# Patient Record
Sex: Male | Born: 1935 | Race: White | Hispanic: No | Smoking: Never smoker
Health system: Southern US, Community
[De-identification: ages and names within clinical notes are randomized; demographics above are authoritative.]

## PROBLEM LIST (undated history)

## (undated) DIAGNOSIS — I639 Cerebral infarction, unspecified: Secondary | ICD-10-CM

## (undated) DIAGNOSIS — I2510 Atherosclerotic heart disease of native coronary artery without angina pectoris: Secondary | ICD-10-CM

## (undated) DIAGNOSIS — M1990 Unspecified osteoarthritis, unspecified site: Secondary | ICD-10-CM

## (undated) DIAGNOSIS — Z5189 Encounter for other specified aftercare: Secondary | ICD-10-CM

## (undated) DIAGNOSIS — I10 Essential (primary) hypertension: Secondary | ICD-10-CM

## (undated) DIAGNOSIS — N289 Disorder of kidney and ureter, unspecified: Secondary | ICD-10-CM

## (undated) HISTORY — PX: BACK SURGERY: SHX140

## (undated) HISTORY — PX: INNER EAR SURGERY: SHX679

## (undated) HISTORY — PX: EYE SURGERY: SHX253

---

## 2007-04-21 ENCOUNTER — Encounter: Admission: RE | Admit: 2007-04-21 | Discharge: 2007-04-21 | Payer: Self-pay | Admitting: Internal Medicine

## 2007-05-19 ENCOUNTER — Encounter (INDEPENDENT_AMBULATORY_CARE_PROVIDER_SITE_OTHER): Payer: Self-pay | Admitting: Urology

## 2007-05-19 ENCOUNTER — Ambulatory Visit (HOSPITAL_BASED_OUTPATIENT_CLINIC_OR_DEPARTMENT_OTHER): Admission: RE | Admit: 2007-05-19 | Discharge: 2007-05-19 | Payer: Self-pay | Admitting: Urology

## 2007-06-30 ENCOUNTER — Ambulatory Visit (HOSPITAL_COMMUNITY): Admission: RE | Admit: 2007-06-30 | Discharge: 2007-06-30 | Payer: Self-pay | Admitting: Urology

## 2007-07-21 ENCOUNTER — Ambulatory Visit (HOSPITAL_BASED_OUTPATIENT_CLINIC_OR_DEPARTMENT_OTHER): Admission: RE | Admit: 2007-07-21 | Discharge: 2007-07-21 | Payer: Self-pay | Admitting: Urology

## 2007-09-15 ENCOUNTER — Ambulatory Visit (HOSPITAL_COMMUNITY): Admission: RE | Admit: 2007-09-15 | Discharge: 2007-09-15 | Payer: Self-pay | Admitting: Urology

## 2008-09-10 ENCOUNTER — Emergency Department (HOSPITAL_COMMUNITY): Admission: EM | Admit: 2008-09-10 | Discharge: 2008-09-10 | Payer: Self-pay | Admitting: Emergency Medicine

## 2008-12-21 ENCOUNTER — Inpatient Hospital Stay (HOSPITAL_COMMUNITY): Admission: EM | Admit: 2008-12-21 | Discharge: 2008-12-24 | Payer: Self-pay | Admitting: Emergency Medicine

## 2008-12-21 ENCOUNTER — Ambulatory Visit: Payer: Self-pay | Admitting: Cardiology

## 2008-12-21 ENCOUNTER — Encounter: Admission: RE | Admit: 2008-12-21 | Discharge: 2008-12-21 | Payer: Self-pay | Admitting: Internal Medicine

## 2008-12-22 ENCOUNTER — Encounter (INDEPENDENT_AMBULATORY_CARE_PROVIDER_SITE_OTHER): Payer: Self-pay | Admitting: Internal Medicine

## 2008-12-22 ENCOUNTER — Ambulatory Visit: Payer: Self-pay | Admitting: Vascular Surgery

## 2009-03-05 IMAGING — US US RENAL
1 series · 14 of 25 positions shown · non-contrast
Comparison: none

CLINICAL DATA: Elevated creatinine.  
RENAL/URINARY TRACT ULTRASOUND:
TECHNIQUE: Complete ultrasound examination of the urinary tract was performed including evaluation of the kidneys, renal collecting systems, and urinary bladder.

[Series 1: unknown · 0.35mm/px · 14 of 55 slices shown]
[im 1/55]
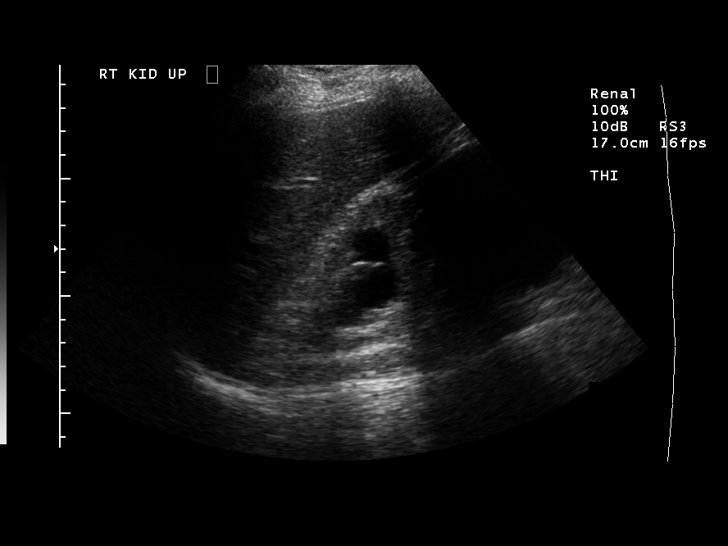
[im 5/55]
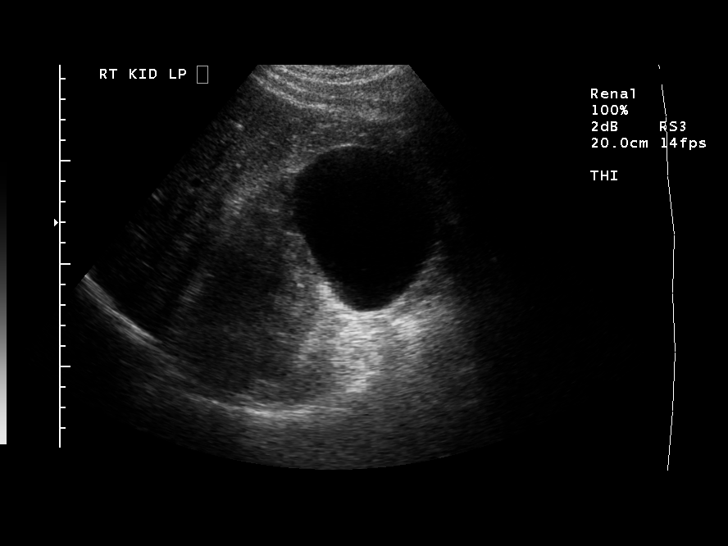
[im 10/55]
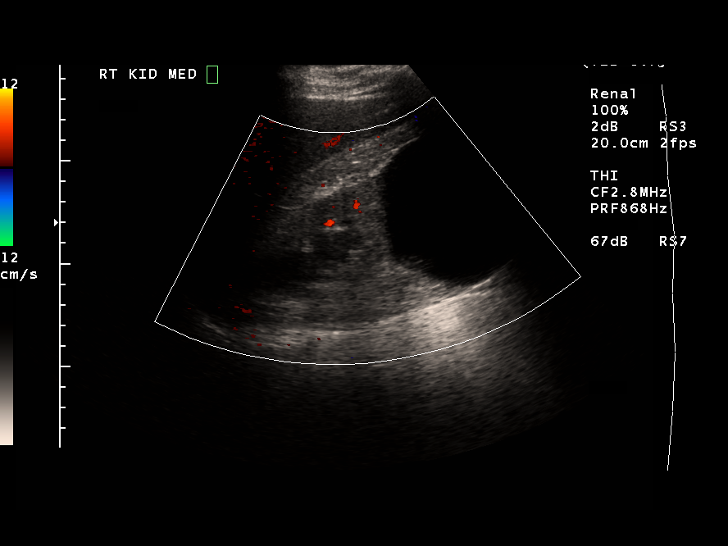
[im 14/55]
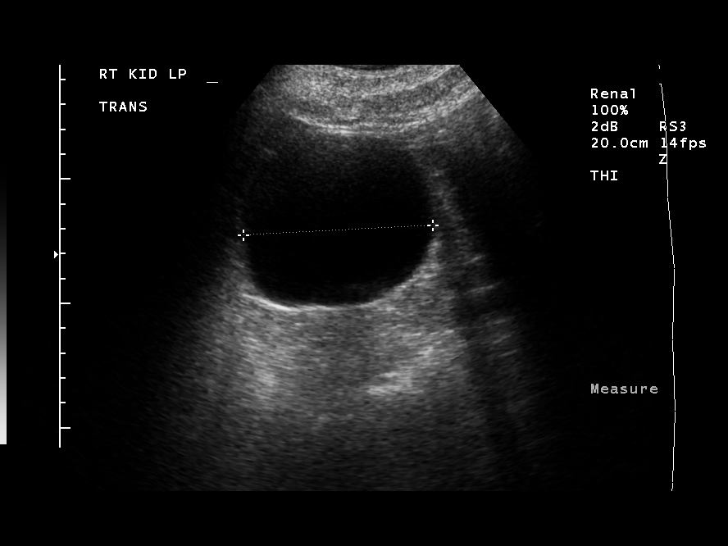
[im 19/55]
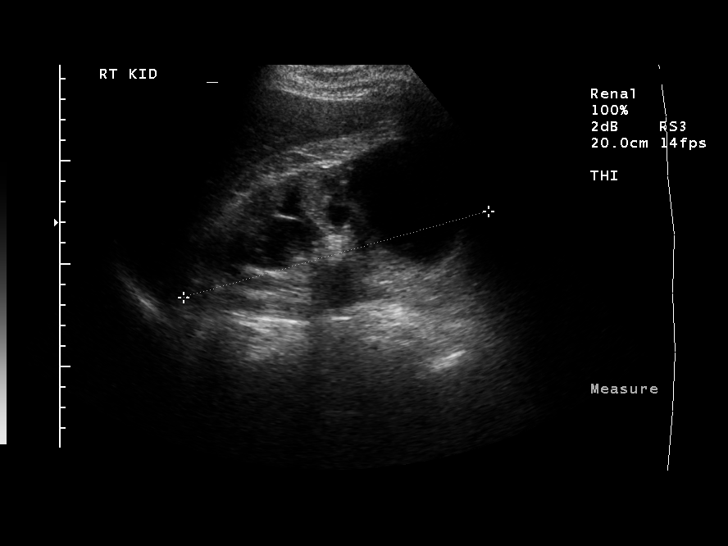
[im 21/55]
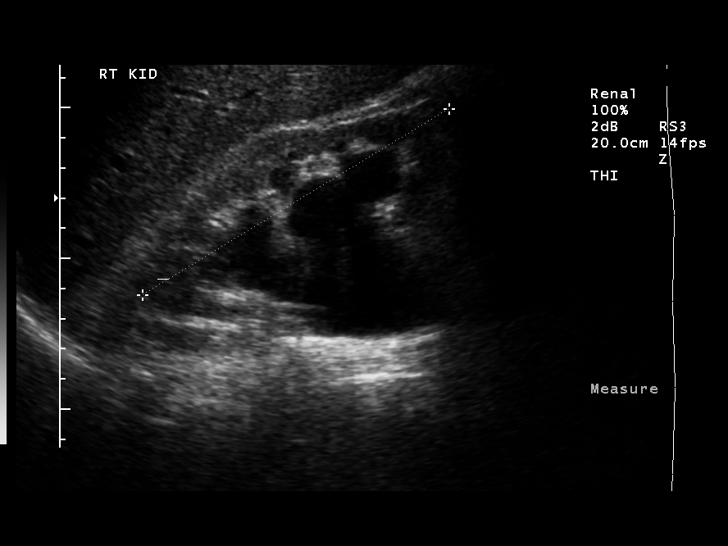
[im 25/55]
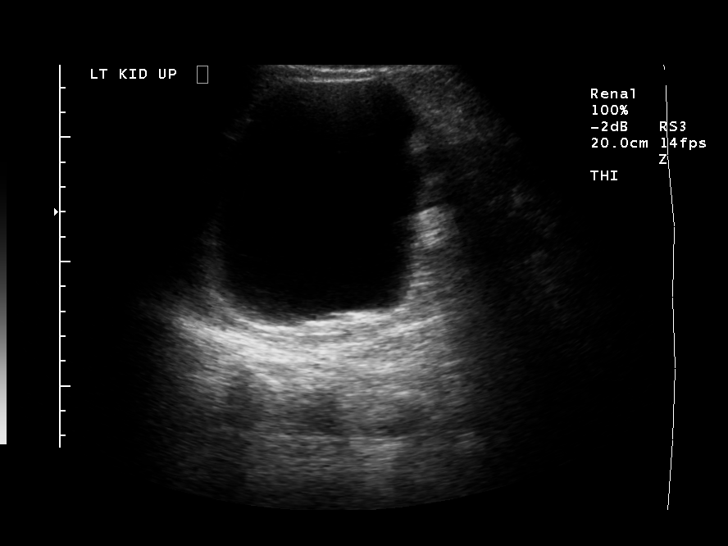
[im 30/55]
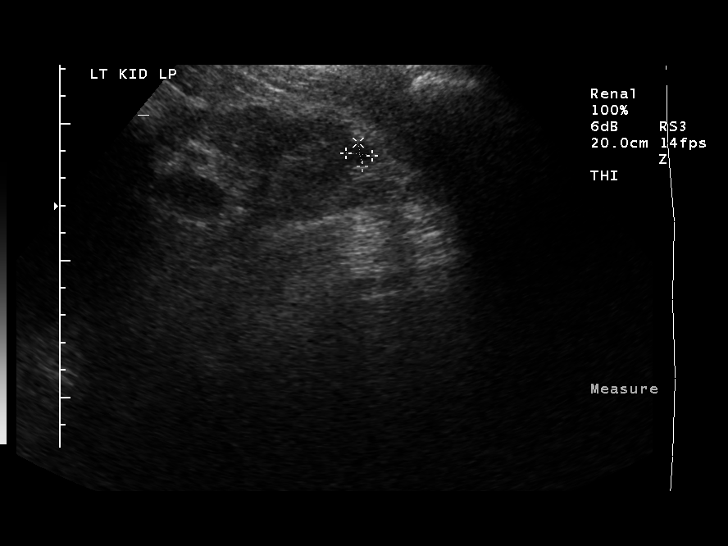
[im 34/55]
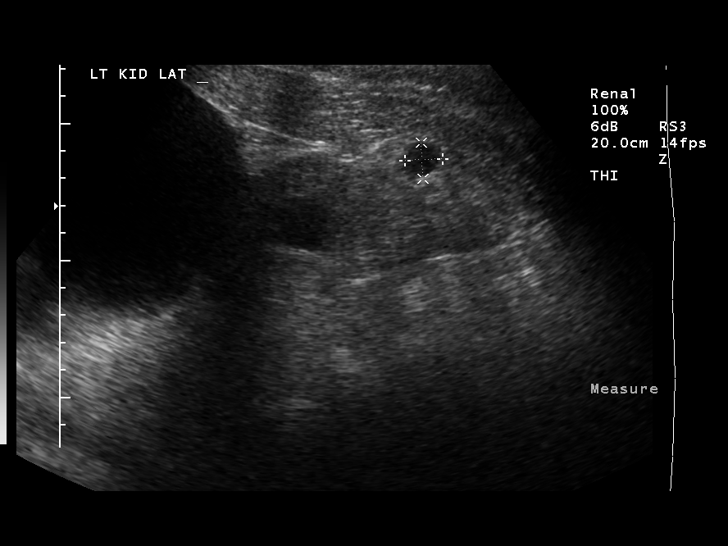
[im 37/55]
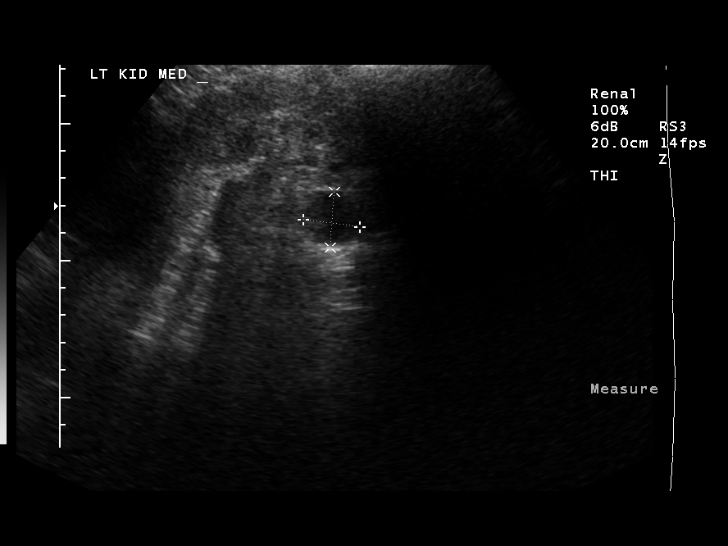
[im 41/55]
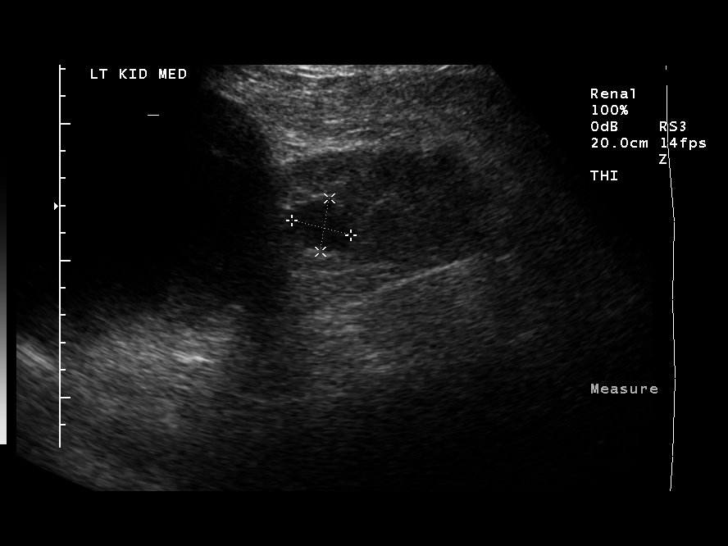
[im 46/55]
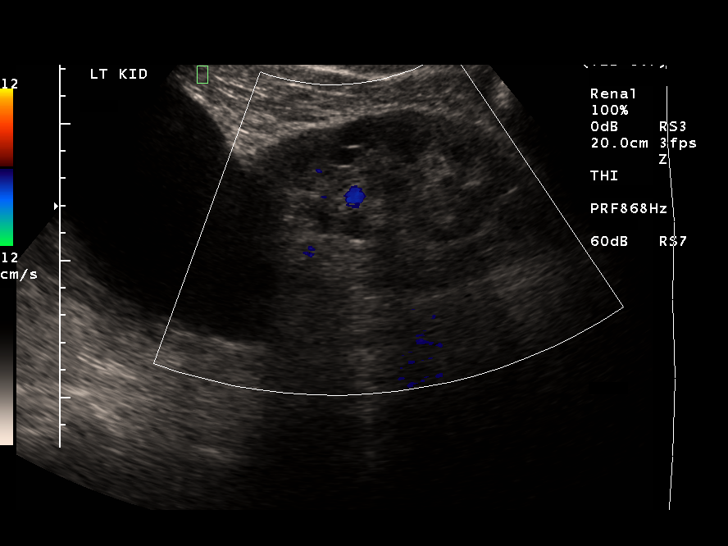
[im 50/55]
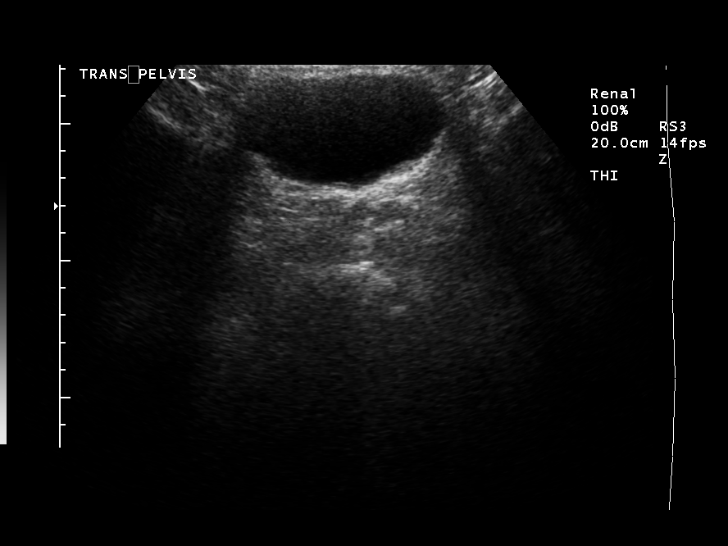
[im 55/55]
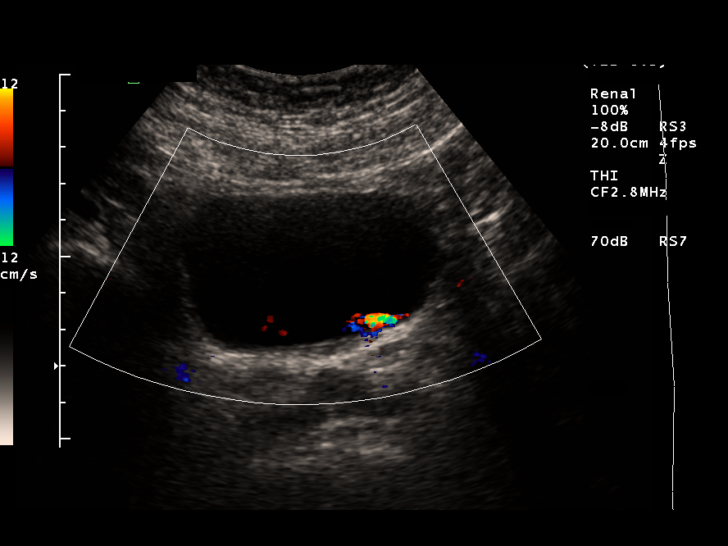

[14 of 25 positions shown; findings below may reference images not displayed]

FINDINGS: Scans over the kidneys were performed.  The right kidney measures 11.4 cm sagittally with the left kidney measuring 11.6 cm.  There is moderate right hydronephrosis and mild left hydronephrosis.  Bilateral renal cysts are present.  The largest cysts are in the upper poles.  On the right the upper pole cyst measures 7.0 x 8.1 x 7.5 cm with the upper pole cyst on the left measuring 8.2 x 9.6 x 8.7 cm.  Urinary bladder is unremarkable and bilateral ureteral jets are noted.
IMPRESSION: Bilateral hydronephrosis right more prominent than left.  Bilateral renal cysts. CT of the abdomen and pelvis may be helpful to assess point of obstruction.

## 2010-09-11 ENCOUNTER — Encounter: Admission: RE | Admit: 2010-09-11 | Discharge: 2010-09-11 | Payer: Self-pay | Admitting: Internal Medicine

## 2010-11-05 ENCOUNTER — Encounter: Payer: Self-pay | Admitting: Internal Medicine

## 2011-01-25 LAB — BASIC METABOLIC PANEL
BUN: 32 mg/dL — ABNORMAL HIGH (ref 6–23)
Chloride: 110 mEq/L (ref 96–112)
GFR calc Af Amer: 46 mL/min — ABNORMAL LOW (ref >60)
GFR calc non Af Amer: 38 mL/min — ABNORMAL LOW (ref >60)
Potassium: 4.5 mEq/L (ref 3.5–5.1)
Sodium: 138 mEq/L (ref 135–145)

## 2011-01-25 LAB — URINALYSIS, MICROSCOPIC ONLY
Ketones, ur: NEGATIVE mg/dL
Leukocytes, UA: NEGATIVE
Nitrite: NEGATIVE
Protein, ur: NEGATIVE mg/dL
Urobilinogen, UA: 0.2 mg/dL (ref 0.0–1.0)

## 2011-01-25 LAB — LIPID PANEL
Cholesterol: 161 mg/dL (ref 0–200)
HDL: 42 mg/dL (ref >39)
Triglycerides: 87 mg/dL (ref <150)

## 2011-01-25 LAB — CBC
HCT: 34.8 % — ABNORMAL LOW (ref 39.0–52.0)
HCT: 40.3 % (ref 39.0–52.0)
Hemoglobin: 13.1 g/dL (ref 13.0–17.0)
Hemoglobin: 14 g/dL (ref 13.0–17.0)
MCHC: 34.6 g/dL (ref 30.0–36.0)
MCV: 101.7 fL — ABNORMAL HIGH (ref 78.0–100.0)
MCV: 98.4 fL (ref 78.0–100.0)
Platelets: 155 10*3/uL (ref 150–400)
Platelets: 161 10*3/uL (ref 150–400)
RBC: 3.43 MIL/uL — ABNORMAL LOW (ref 4.22–5.81)
RBC: 4.1 MIL/uL — ABNORMAL LOW (ref 4.22–5.81)
RDW: 13.8 % (ref 11.5–15.5)
WBC: 6.5 10*3/uL (ref 4.0–10.5)
WBC: 7.4 10*3/uL (ref 4.0–10.5)

## 2011-01-25 LAB — COMPREHENSIVE METABOLIC PANEL
ALT: 12 U/L (ref 0–53)
AST: 21 U/L (ref 0–37)
Albumin: 3.6 g/dL (ref 3.5–5.2)
Chloride: 109 mEq/L (ref 96–112)
Creatinine, Ser: 1.79 mg/dL — ABNORMAL HIGH (ref 0.4–1.5)
GFR calc Af Amer: 44 mL/min — ABNORMAL LOW (ref >60)
GFR calc non Af Amer: 36 mL/min — ABNORMAL LOW (ref >60)
Glucose, Bld: 90 mg/dL (ref 70–99)
Potassium: 4.7 mEq/L (ref 3.5–5.1)
Sodium: 138 mEq/L (ref 135–145)

## 2011-01-25 LAB — CK TOTAL AND CKMB (NOT AT ARMC)
CK, MB: 1.4 ng/mL (ref 0.3–4.0)
Relative Index: 1.2 (ref 0.0–2.5)
Relative Index: 1.2 (ref 0.0–2.5)
Total CK: 119 U/L (ref 7–232)

## 2011-01-25 LAB — DIFFERENTIAL
Basophils Relative: 0 % (ref 0–1)
Lymphs Abs: 2 10*3/uL (ref 0.7–4.0)
Monocytes Relative: 9 % (ref 3–12)
Neutrophils Relative %: 57 % (ref 43–77)

## 2011-01-25 LAB — HEMOGLOBIN A1C: Hgb A1c MFr Bld: 5.6 % (ref 4.6–6.1)

## 2011-01-25 LAB — URINE CULTURE: Culture: NO GROWTH

## 2011-01-25 LAB — APTT: aPTT: 28 seconds (ref 24–37)

## 2011-01-25 LAB — TROPONIN I
Troponin I: 0.01 ng/mL (ref 0.00–0.06)
Troponin I: 0.01 ng/mL (ref 0.00–0.06)

## 2011-01-25 LAB — PROTIME-INR: Prothrombin Time: 13.6 seconds (ref 11.6–15.2)

## 2011-02-27 NOTE — Op Note (Signed)
NAME:  Gavin Duran, Gavin Duran                 ACCOUNT NO.:  [id number]   MEDICAL RECORD NO.:  [id number]          PATIENT TYPE:  AMB   LOCATION:  DAY                          FACILITY:  [facility]   PHYSICIAN:  Mark C. Vernie Ammons, M.D.  DATE OF BIRTH:  1920/03/21   DATE OF PROCEDURE:  09/15/2007  DATE OF DISCHARGE:                               OPERATIVE REPORT   PREOPERATIVE DIAGNOSIS:  Right ureteral calculi.   POSTOPERATIVE DIAGNOSIS:  Right ureteral calculi.   PROCEDURE:  Cystoscopy, right retrograde pyelogram with interpretation,  right ureteroscopy, laser lithotripsy, stone extraction and Polaris  stent placement.   SURGEON:  Mark C. Vernie Ammons, M.D.   ANESTHESIA:  General.   SPECIMEN:  Stone to patient.   DRAINS:  5-French 26 cm Polaris stent (no string).   BLOOD LOSS:  Minimal.   COMPLICATIONS:  None.   INDICATIONS:  The patient is a 77 year old white male who initially was  found to have hydronephrosis and a right ureteral stone was identified,  treated with lithotripsy.  Lithotripsy was unsuccessful.  He was brought  to the operating room for ureteroscopic extraction of the stone and I  was able to fragment the stone.  It was extremely hard and I was able to  remove what appeared to be a majority of the stone but there were some  fragments remaining.  I left the stent and waited the fragments to pass;  however, they appeared to have hung up in the ureter both in the upper  ureter and lower ureter.  He is brought to the operating room for repeat  ureteroscopic extraction of the remaining fragments.  The risks,  complications and alternatives have been discussed.  He understands and  elected to proceed.   DESCRIPTION OF OPERATION:  After informed consent, the patient was  brought to the major OR, placed on the table and administered general  anesthesia, then moved to the dorsal lithotomy position.  His genitalia  sterilely prepped and draped and official time-out was then  performed.   A 6-French rigid ureteroscope was then passed per urethra under direct  visualization into the bladder.  Again no lesions were found within the  bladder.  The right orifice noted to be widely patent and I was easily  able to pass the scope into the right orifice and up the right ureter.  Several centimeters above the bladder, a stone was encountered.  It was  grasped with the nitinol basket and extracted.  The second stone was  noted to be proximal to this, however it was felt to be too large and of  a configuration that would not easily be extractable.   The 273 micron holmium laser fiber was then passed through the  ureteroscope and the stone was fragmented.  After fragmenting it into  several smaller pieces, the nitinol basket was then reinserted and the  small pieces were then extracted.  I then continued to remove all distal  ureteral fragments.   I then passed the rigid ureteroscope up the right ureter and then noted  a second group of stones.  To  further delineate this a retrograde  pyelogram was performed.  This was performed in standard fashion by  injecting full strength contrast through the ureteroscope under direct  fluoroscopic control.  This revealed filling defects consistent with the  ureteral stones and blunting of the calyces.  The renal collecting  system though appeared to have no filling defects or mass effect.  It  appeared entirely normal.   I was able to extract one of the lead fragments with the nitinol basket.  However of the remaining fragments appeared large.  I therefore  reinserted and the laser and began to fragment the stones; however, they  migrated back into the kidney.   A 0.038 inches floppy tip guidewire was then passed through the  ureteroscope and left in place.  The rigid ureteroscope was removed and  the flexible ureteroscope was passed over the guidewire into the area of  the kidney.  The guidewire was removed.  The renal pelvis  and kidney was  fully inspected and this stone was seen in the upper pole.  This was  grasped with nitinol basket pulled into the ureter and was too large to  extract so I fragmented it with the laser and then extracted the  individual pieces with a nitinol basket.  I then was able to reinsert  the flexible ureteroscope per urethra and under direct visualization was  able to pass it up the right ureter and into the renal pelvis again.  Again I identified one further stone and then injected further contrast  and made sure I was in the visualizing all of the calyces.  Each calix  was visited individually with the flexible ureteroscope and only one  further stone remained in the kidney that could be visualized.  I  therefore grasped that with a nitinol basket and again brought it down  approximately the mid ureteral region where it was then fully fragmented  with the laser and the fragments were then extracted.  I then inserted  the guidewire and reinserted the flexible scope into the area of the  renal pelvis and inspected this one last time noting each calix was free  of stones.  There were no stones in the renal pelvis and no stones were  seen along the course of the ureter as the scope was then withdrawn down  to the lower ureter.  I passed it on up the ureter and passed a  guidewire in the renal pelvis then removed the ureteroscope.   The 22-French cystoscope and 12 degrees lens were then back loaded over  the guidewire and the Polaris stent was passed into the area of the  right renal pelvis.  The guidewire was removed and good curl was noted  in the renal pelvis.  I then noted the distal aspect of the stent in the  bladder in good position and drained the bladder, removed the  cystoscope.  The patient was awakened and taken to recovery room in  stable satisfactory condition.  He tolerated procedure well.  There were  no intraoperative complications.  He will be given a prescription  for 30  Pyridium 200 mg tabs and return to my office in 7 days for stent  removal.      Mark C. Vernie Ammons, M.D.  Electronically Signed     MCO/MEDQ  D:  09/15/2007  T:  09/15/2007  Job:  [id number]

## 2011-02-27 NOTE — Op Note (Signed)
NAMEOSAMA, Gavin Duran                 ACCOUNT NO.:  [id number]   MEDICAL RECORD NO.:  [id number]          PATIENT TYPE:  AMB   LOCATION:  [facility]                         FACILITY:  [facility]   PHYSICIAN:  Mark C. Vernie Ammons, M.D.  DATE OF BIRTH:  [birth year]   DATE OF PROCEDURE:  07/21/2007  DATE OF DISCHARGE:                               OPERATIVE REPORT   PREOPERATIVE DIAGNOSIS:  Right ureteral calculus.   POSTOPERATIVE DIAGNOSIS:  Right ureteral calculus.   OPERATION PERFORMED:  1. Cystoscopy.  2. Right ureteroscopy.  3. Right retrograde pyelogram with interpretation.  4. Laser lithotripsy.  5. Double-J stent placement.   SURGEON:  Mark C. Vernie Ammons, M.D.   ANESTHESIA:  General.   DRAINS:  A 7 French 26 cm double-J stent in the right ureter (no  string).   ESTIMATED BLOOD LOSS:  The blood loss was minimal.   SPECIMENS:  None.   COMPLICATIONS:  None.   INDICATIONS:  The patient is an 77 year old white male who was found  have right hydronephrosis.  Because of some renal insufficiency he was  brought to the operating room for retrograde pyelogram and I thought for  soft filling defect in the ureter.  When I scoped the ureter I did not  see a stone.  I thought I saw some irritation and biopsy as well as  brush did not find any malignancy.  Then he had a stent for a while and  after I took his stent out the next day he had right flank pain and now  had a stone in his right ureter that appeared not to have been in the  ureter at the time of the procedure to since I passed the scope nearly  to the UPJ and was not able to visualize the stone.  He elected to  proceed with lithotripsy of the stone; however, lithotripsy was  unsuccessful.  He is therefore brought to the operating room for a  ureteroscopic treatment of stone with laser lithotripsy.  I went over  the procedure, its risks and complications with him.  He understood and  elected to proceed.   DESCRIPTION OF THE OPERATION:   After informed consent the patient was  brought to the major OR, placed table, administered general anesthesia  and then was moved the dorsal lithotomy position.  The genitalia was  sterilely prepped and draped, and official timeout was performed.  I  then initially inserted the 6 French rigid ureteroscope under direct  visualization down the urethra and into the bladder.  I was able to  visualize the right ureteral orifice and I was easily able to pass the  scope into the right orifice and up the right ureter under direct  visualization.  I was able to see the stone on fluoroscopy and the scope  passed nearly to the stoma, but I could not quite see the stone.  I  therefore performed a retrograde pyelogram.   Retrograde pyelogram was performed through the rigid scope using full  strength contrast and I noted an S turn in the ureter that was not being  caused by the scope pushing the ureter upward, but likely was because of  the presence of previous hydronephrosis causing some tortuosity of the  ureter.  It did not appear obstructed in that location though.  I  therefore passed a 0.038 inch Glidewire through the ureteroscope and  through the area; and, was able to then pass the scope over the wire and  up to the stone.  It was visualized and  because of a dilated proximal  ureter and my concern that the stone would be difficult to treat because  it would float away, I elected to use a stone cone.   The stone cone was passed through the ureteroscope.  I was able to  deploy the cone beyond the stone, but it turned out that the ureter was  so dilated that the cone would not maintain the stone; therefore, I  removed that.  I then passed the holmium laser fiber through the rigid  scope and began to treat the stone; however, it was difficult because  the stone was not held in the ureter since it was dilated.  I therefore  placed the patient in reverse Trendelenburg to try get the stone to   maintain position.  However, this was not particularly successful.  The  stone kept migrating up and I felt there was need for the flexible  ureteroscope as the stone was passing into the kidney.   I passed a guidewire through the ureteroscope and removed the rigid  ureteroscope, and replaced with a 6 French flexible ureteroscope.  I  then removed the guidewire and was able to visualize the stone again in  the upper ureter.  I began treating it again with the laser and it did  fall back into the kidney.  Once it had done so I then placed the  patient in reverse Trendelenburg and tried to get all the stones to  aggregate in one location in the kidney.  I then be continued to treat  them until they appeared to be fully fragmented.  Because the system was  so capacious I inspected the entire kidney and I did not find any large  pieces of stone.  I could not see any large pieces on fluoroscopy  either; however, I think it is possible that there may still be some  fairly sizable pieces of stone just because of the large size and its  extremely hard in nature.   I reinserted a guidewire through the ureteroscope, removed the  ureteroscope and back loaded the cystoscope.  The 7 French 26 cm double-  J stent was then passed over the guidewire into the area of the renal  pelvis and the guidewire was removed.  A good curl was noted in the  renal pelvis and in the bladder fluoroscopically, and the bladder was  drained.  The cystoscope was removed.   The patient was awakened and taken to the recovery room in stable  satisfactory condition.  He tolerated procedure well.  There were no  intraoperative complications.   I wrote him a prescription for 3 days of Cipro at 250 mg twice a day.  I  am going to see him back in 1 week in the office for a KUB. I told his  family that it is very possible that there may still be some large  fragments that may require a second ureteroscopic treatment, but I  would  wait to see what the x-ray showed.  Mark C. Vernie Ammons, M.D.  Electronically Signed     MCO/MEDQ  D:  07/21/2007  T:  07/22/2007  Job:  [id number]

## 2011-02-27 NOTE — Op Note (Signed)
NAMETYRAE, ALCOSER                 ACCOUNT NO.:  [id number]   MEDICAL RECORD NO.:  [id number]          PATIENT TYPE:  AMB   LOCATION:  [facility]                         FACILITY:  [facility]   PHYSICIAN:  Mark C. Vernie Ammons, M.D.  DATE OF BIRTH:  November 24, 1919   DATE OF PROCEDURE:  05/19/2007  DATE OF DISCHARGE:                               OPERATIVE REPORT   PREOPERATIVE DIAGNOSES:  1. Bilateral hydronephrosis (right greater than left).  2. Elevated creatinine.   POSTOPERATIVE DIAGNOSES:  1. Right hydronephrosis.  2. Elevated creatinine.  3. Possible right ureteral lesion.   PROCEDURE:  Cystoscopy, bilateral retrograde pyelograms with  interpretation, right ureteroscopy, right ureteral biopsy and double-J  stent placement in the right ureter (6-French, 26 cm).   SURGEON:  Dr. Vernie Ammons.   ANESTHESIA:  General.   SPECIMENS:  1. Right ureteral biopsy.  2. Right ureteral washings.  3. Right ureteral brushing, all sent to pathology.   BLOOD LOSS:  Minimal.   COMPLICATIONS:  None.   INDICATIONS:  The patient is an 77 year old white male who had a normal  creatinine of 1.64 in February 2008. Four months later it had elevated  to 2.13 and an ultrasound revealed moderate right hydronephrosis and  mild left hydronephrosis.  The patient had no voiding symptoms.  Because  of it is elevated creatinine, we discussed evaluation with cystoscopy  and retrograde pyelograms with possible ureteroscopy.  The risks,  complications, and alternatives were discussed.  The patient understands  and elected to proceed.   DESCRIPTION OF OPERATION:  After informed consent, the patient was  brought to the major OR, placed on the table and administered general  anesthesia and then moved to the dorsal lithotomy position after which  time an official time-out was obtained.  His genitalia was then  sterilely prepped and draped.  Initially a 22-French cystoscope was  introduced. Under direct visualization with a 12  degree lens, the  urethra is noted to be entirely normal without lesion down to the  sphincter which appears intact.  The prostatic urethra is well resected,  no lesions are noted.  The bladder neck is wide open and the bladder was  then entered and noted to be lined with normal-appearing pink mucosa  throughout.  It was fully inspected and no tumor, stones or inflammatory  lesions were seen.  Both ureteral orifice orifices were easily  identified and both were noted to be effluxing clear urine.   A right retrograde pyelogram was then performed by passing a 6-French  open-ended ureteral catheter through the cystoscope and into the opening  of the right ureter.  I then injected full strength iodinated contrast  through the open-ended stent up the right ureter under direct  fluoroscopy.  This revealed a normal-appearing ureter all the way to an  area just below the UPJ on the right-hand side.  There was difficulty  getting the contrast to pass on into the kidney.  I therefore passed a  0.038 inch floppy-tip guidewire through the open-ended catheter up the  right ureter and into the area of the renal pelvis.  I then passed the  open-ended ureteral catheter up to the area just below the UPJ and  removed the guidewire and then reinjected contrast.  Doing this, I  identified what appeared to be a persistent filling defect in the ureter  with a normal ureter distal to that and a hydronephrotic kidney  proximally. I advanced the open-ended catheter into the renal pelvis and  injected further contrast noting severe blunting of the calyces  indicating longstanding obstruction.  I then inserted the guidewire  again through the open-ended stent and removed the stent.  The  cystoscope was used to removed as well.   A 6-French rigid long ureteroscope was then passed under direct  visualization down the urethra and into the right ureteral orifice  without difficulty.  I then passed the scope up the  right ureter under  direct visualization.  When I got to the area that I had previously  noted a filling defects, I could not definitely identify a lesion or  stone to account for this although there did appear to be some either  inflammation or papillary changes of the ureter.  I then advanced the  scope on into the renal pelvis and noted no evidence of UPJ obstruction.  I then withdrew the scope back to the area that was previously seen and  appeared different than the remainder of the ureter.  I then passed a  brush through the ureteroscope and obtained a brush biopsy of the area.  This was sent to pathology.  I then obtained urine in a barbotage  fashion from that location while actually using the scope to try to  dislodge some cells from that area while aspirating the fluid through  the ureteroscope.  This was sent for cytology.  Then I passed a biopsy  forceps through the ureteroscope and obtained representative biopsies of  the area.  There was no significant bleeding noted.  I then removed the  ureteroscope.  The guidewire had remained in place so  I back-loaded the  cystoscope over the guidewire and passed the double-J stent over the  guidewire into the area of the renal pelvis removing the guidewire. A  good curl was noted in the renal pelvis and the bladder.  I then drained  the bladder.   I then identified the left ureteral orifice.  A left retrograde  pyelogram was performed in an identical fashion as the right side,  however, this revealed the ureter to be entirely normal throughout its  length with no filling defects or other abnormality.  The collecting  system was sharp with no blunting of the calyces and no filling defects  or mass effect.  My interpretation of this left retrograde pyelogram was  that it is entirely normal.  I then drained the bladder and removed the  cystoscope.  The patient was awakened and taken to the recovery room in  stable satisfactory condition.   He tolerated the procedure well, there  were no intraoperative complications.  He will be given a prescription  for Pyridium Plus #36 and Vicodin ES #28.  He will follow-up in my  office in 1 week to discuss the pathology report and further treatment  based upon that.      Mark C. Vernie Ammons, M.D.  Electronically Signed     MCO/MEDQ  D:  05/19/2007  T:  05/19/2007  Job:  [id number]

## 2011-02-27 NOTE — H&P (Signed)
NAME:  Gavin Duran, BARREN NO.:  [id number]   MEDICAL RECORD NO.:  [id number]          PATIENT TYPE:  EMS   LOCATION:  MAJO                         FACILITY:  [facility]   PHYSICIAN:  Elliot Duran, M.D.    DATE OF BIRTH:  [birth year]   DATE OF ADMISSION:  12/21/2008  DATE OF DISCHARGE:                              HISTORY & PHYSICAL   PRIMARY CARE PHYSICIAN:  Antony Madura, M.D.   CHIEF COMPLAINT:  Right-sided weakness, word searching, generalized  malaise and possible syncopal episode.   HISTORY OF PRESENT ILLNESS:  The patient is an 77 year old man with a  past medical history significant for hypertension, hyperlipidemia and  TIAs in the past.  He presents as a direct admission for further  evaluation of right-sided weakness, word searching, generalized malaise  and a possible syncopal episode.  Gavin Duran evaluated the  patient yesterday in his office.  He ordered an MRI of the brain for  evaluation.  The MRI of the brain was performed today and it revealed a  1.5 cm acute infarction affecting the anterior  thalamus/posterior limb internal capsule on the left.  The patient is  very hard of hearing and the history is being provided by not only the  patient, but also by the patient's niece, Gavin Duran.  Accordingly, over  the past 3 days the patient had not been feeling well.  He fell 3 days  ago because he became dizzy and he believes that he passed out.  A  friend who was present at home helped him up.  He apparently regained  consciousness immediately.  There was no obvious head trauma.  During  that day, the patient slept quite a bit according to the history.  However, there was no evidence of slurred speech, difficulty swallowing  or a facial droop.  Over the 2 following days, Gavin Duran noticed that  the patient's right foot was dragging a little while he was walking.  She also noticed that he was having difficulty finding the right words  to say when  he was speaking.  Over the past 48 hours, the patient's  appetite actually improved and he was eating without any difficulty.  In  fact, Gavin Duran felt that he was back to baseline yesterday.  Nevertheless, he did present to Dr. Su Duran' office for further  evaluation.  When the results of the MRI were resulted, the patient was  advised to come to the hospital.  Currently, the patient has no  complaints.  He does acknowledge that he is somewhat weaker on his right  side.  He is right-handed.  He says that at times he has had difficulty  swallowing in the past, but no worsening difficulty swallowing over the  last 24-48 hours.  He denies headache, numbness, blurred vision,  dizziness currently, chest pain, shortness of breath, abdominal pain,  painful urination, nausea, vomiting, diarrhea or bloody stools.   The patient's vital signs are currently within normal limits.  He is  being admitted for further evaluation and management.   PAST MEDICAL HISTORY:  1.  Hypertension.  2. Hyperlipidemia.  3. BPH.  4. Chronic dizziness, question vertigo.  5. Macular degeneration.  6. Acute renal failure with a creatinine of 2.13 secondary to      hydronephrosis from a right ureteral calculus in August 2008.  7. Status post cystoscopy, right uroscopy, laser lithotripsy and      placement of double-J stent per Dr. Vernie Duran in October 2008.  8. Status post cystoscopy, right retrograde pyelogram with      interpretation, right ureteroscopy, laser lithotripsy, stone      extraction and Polaris stent placement per Dr. Vernie Duran in December      2008.  9. Fractured left elbow in November 2009.  10.Chronic pruritus/chronic dermatitis.  The patient is followed by      dermatologist, Dr. Lonni Duran.  The patient was also evaluated by a      dermatologist at [facility] in the past.  The      exact diagnosis is unclear.  11.History of TIAs  12.Chronic tremor.  13.Chronic hard of  hearing/decrease in hearing acuity.   MEDICATIONS:  1. Flomax 0.4 mg daily.  2. Lisinopril/HCTZ 20/12.5 mg daily.  3. Simvastatin 40 mg daily.  4. Aspirin 81 mg daily.  5. Macular Protect Complete supplement once daily.  6. Beta Carotene Free supplement once daily.  7. Advanced Omega 3 supplement daily.  8. Betamethasone Dipropionate 0.5% daily as needed for itching.   ALLERGIES:  NO KNOWN DRUG ALLERGIES.   SOCIAL HISTORY:  The patient is widowed.  [place]  He has an Geophysicist/field seismologist who comes in every Wednesday and  Friday.  His closest niece, Gavin Duran (phone number [phone]) is  his durable power of attorney.  She is present today.  The patient is a  retired Banker.  He denies smoking tobacco use.  However, he  does chew a pack of tobacco daily.  He generally has 1 beer at bedtime  nightly.  He still drives.   FAMILY HISTORY:  Positive for emphysema, brain cancer and old age.   PHYSICAL EXAMINATION:  VITAL SIGNS:  Temperature 98.1, blood pressure  118/63, pulse 63, respiratory rate 20, oxygen saturation 97% on room  air.  GENERAL:  The patient is a pleasant, alert, elderly 77 year old  Caucasian man who is currently sitting up in bed in no acute distress.  HEENT:  Head is normocephalic, nontraumatic.  Pupils are equal, round,  reactive to light.  Extraocular movements are intact.  Conjunctivae are  clear.  Sclerae are white.  There is possibly a faint right-sided  ptosis.  Tympanic membranes are clear bilaterally.  Nasal mucosa is  mildly dry.  No sinus tenderness.  Oropharynx reveals a full set of  dentures.  Mucous membranes are mildly dry.  No posterior exudates or  erythema.  NECK:  Supple.  No adenopathy, no thyromegaly, no bruit, no JVD.  LUNGS:  Decreased breath sounds in the bases, otherwise clear.  HEART:  S1-S2 with a 1-2/6 systolic murmur.  ABDOMEN:  Mildly obese, positive bowel sounds, soft, nontender,   nondistended.  No hepatosplenomegaly, no mass is palpated.  GU/RECTAL:  Deferred.  EXTREMITIES:  Pedal pulse is barely palpable.  No pretibial edema and no  pedal edema.  NEUROLOGIC:  The patient is alert and oriented x3.  He is chronically  hard of hearing.  There is a faint right ptosis.  No detection of  dysarthria, dysphagia or aphasia.  Hand grip on the  right is  approximately 4+ to 5/-5 and hand grip on the left is 5/5.  Right lower  extremity strength is approximately 4-/5 and strength of the left lower  extremity is approximately 5-/5.  Sensation is grossly intact  bilaterally.  Cerebellar with finger-to-nose intact.  However, there is  a mild right-sided resting tremor and mild intention tremor on the right  greater than the left.   LABORATORY DATA:  Admission laboratories are pending.   ASSESSMENT:  1. Acute 1.5 centimeter infarction of the left thalamus and posterior      limb of the internal capsule per MRI performed at [facility] today, December 21, 2008.  The patient does have evidence of      mild right-sided hemiparesis.  There appears to be no active      evidence of dysphagia, aphasia or dysarthria.  There was a      questionable syncopal episode which may or may not have been      related to the acute stroke which probably occurred greater than 24      hours ago.  He is treated chronically with aspirin therapy.      Obviously, he has failed aspirin therapy.  2. Hypertension.  The patient is treated chronically with HCTZ and      lisinopril.  His blood pressure is within normal limits.  3. Hyperlipidemia.  The patient is treated chronically with Zocor and      a number of over-the-counter supplements.  4. Chronic tremor with a history of transient ischemic attacks in the      past.  5. Chewing tobacco use.   PLAN:  1. The patient is being directly admitted for further evaluation and      management.  2. We will evaluate the patient's swallowing with a  bedside swallow      evaluation to be carried out by the registered nurse.  If the      patient fails, a speech therapist will be consulted for further      evaluation of dysphagia.  3. We will add Plavix at 75 mg daily.  We will continue all of his      other chronic medications.  4. For further evaluation, we will check baseline laboratory studies      including a CMET, TSH, hemoglobin A1c, fasting lipid panel, BNP,      etc.  We will also assess the patient further with a chest x-ray      and EKG.  His fasting lipid panel will be assessed in the morning.  5. We will consult the physical and occupational therapists.  We will      also consult the case manager and/or the clinical social worker for      additional assistance with home health therapy or short-term      skilled nursing facility placement for rehab if needed.  6. Tobacco cessation counseling.      Elliot Duran, M.D.  Electronically Signed     DF/MEDQ  D:  12/21/2008  T:  12/21/2008  Job:  [id number]   cc:   Antony Madura, M.D.

## 2011-02-27 NOTE — Discharge Summary (Signed)
NAMESAMIEL, Gavin Duran                 ACCOUNT NO.:  [id number]   MEDICAL RECORD NO.:  [id number]          PATIENT TYPE:  INP   LOCATION:  3003                         FACILITY:  [facility]   PHYSICIAN:  Ruthy Dick, MD    DATE OF BIRTH:  August 16, 1920   DATE OF ADMISSION:  12/21/2008  DATE OF DISCHARGE:  12/24/2008                               DISCHARGE SUMMARY   REASON FOR ADMISSION:  Right-sided weakness, word searching, generalized  malaise, and possible syncopal episode.   FINAL DISCHARGE DIAGNOSES:  1. Acute cerebrovascular accident.  2. Dyslipidemia.  3. Hypertension.  4. Chronic kidney disease, stage III.  5. Macular degeneration.  6. Benign prostatic hypertrophy.  7. Chronic dizziness.  8. History of transient ischemic attack.  9. Chronic pruritus versus dermatitis.  10.Hearing impairment.   CONSULTATIONS DURING THIS ADMISSION:  None.   PROCEDURES DONE DURING THIS ADMISSION:  MRI of the head was done and  showed a 1.5-cm region of acute infarction affecting the anterior  thalamic/posterior limb of the internal capsule on the left, no  hemorrhage was noted.   BRIEF HISTORY OF PRESENTING ILLNESS AND HOSPITAL COURSE:  This is an 77-  year-old old white male with past medical history significant for  hypertension, dyslipidemia, and TIAs who came in as a direct admission  for further evaluation of right-sided weakness, word searching, and  generalized malaise.  The patient was seen in Dr. Windy Fast Robert's office  a day prior to this presentation.  MRI was ordered and showed a 1.5-cm  acute infarction affecting the anterior thalamus and posterior limb of  the internal capsule on the left.  Because of this, he was admitted for  further evaluation.  Workup included a 2-D echo, which showed a 60%  ejection fraction with no source of embolus noted.  Carotid Doppler was  also done, and there was no carotid stenosis noted bilaterally.  Because  the patient had been on aspirin prior to  the stroke, Plavix was added to  his regimen.  He has done well in the hospital and is close to his  baseline.  He is able to hold more conversation, and his speech has  improved tremendously.  Physical therapy saw the patient and recommended  that the patient should be in somewhere where he can be monitored 24  hours, preferably a skilled nursing facility.  We discussed this at  length with the patient, but he is adamant that he wants to go home,  even though he lives alone and knows the risk of falling down or hurting  himself while being on his own.  For the last couple of days, we have  tried to convince him to consider going to a skilled nursing facility  for about a couple of weeks or so before going home, but he is adamant  that he wants to go home directly.  I have spoken to his health care  power of attorney, and she plans to come down today and speak to him  again before he is being discharged.  In any case, he seems to be aware  of the fact that the skilled nursing facility would have been the best  option for him, but he is willing to make towards home health with  physical therapy.  He says that somebody comes to his house every  Wednesday and Friday to check on him, but he believes that this lady  will be coming by every day to check on him now.  All this will be  sorted out when we discuss with the social service and health care power  of attorney later today.  She did also agree to go to skilled nursing  facility and that will be a good decision, but he is adamant to go home.  We have no options but to respect his wishes.  He is clinically stable  to be discharged today.   PHYSICAL EXAMINATION:  VITAL SIGNS:  Temperature of 98.3, pulse 52,  respirations 20, blood pressure 96/50, and saturating 97% on room air.  CHEST:  Clear to auscultation bilaterally.  ABDOMEN:  Soft, nontender.  EXTREMITIES:  No clubbing, no cyanosis, no edema.  CARDIOVASCULAR:  First and second heart  sounds only.  CENTRAL NERVOUS SYSTEM:  The patient seems to have power in all  extremities at 5/5 and no obvious deficit except for his hearing  impairment, which is chronic.   DISCHARGE MEDICATIONS:  1. Flomax 0.4 mg daily.  2. Lisinopril/hydrochlorothiazide 20/12.5 mg daily.  3. Simvastatin 40 mg daily.  4. Aspirin 81 mg daily.  5. Multivitamins 1 tablet daily.  6. Omega-3 supplements 1 tablet daily.  7. Beta-carotene supplement 1 tablet daily.  8. Betamethasone dipropionate 0.05% cream applied to itchy areas once      daily.  9. Plavix 75 mg p.o. daily.   He is to follow up with Dr. Burton Apley in 1-2 weeks.  The patient is  to call for this appointment.  He is to go home on home health with  physical therapy and occupational therapy.  Family should try to provide  somebody to be with him at all times as much as possible.   TIME USED FOR DISCHARGE PLANNING:  Greater than 30 minutes.      Ruthy Dick, MD  Electronically Signed     GU/MEDQ  D:  12/24/2008  T:  12/24/2008  Job:  [id number]   cc:   Antony Madura, M.D.

## 2011-07-23 LAB — HEMOGLOBIN AND HEMATOCRIT, BLOOD
HCT: 36.2 — ABNORMAL LOW
Hemoglobin: 12.6 — ABNORMAL LOW

## 2011-07-23 LAB — BASIC METABOLIC PANEL
BUN: 20
CO2: 21
Calcium: 9.2
Chloride: 110
Creatinine, Ser: 1.66 — ABNORMAL HIGH
GFR calc Af Amer: 48 — ABNORMAL LOW
GFR calc non Af Amer: 40 — ABNORMAL LOW
Glucose, Bld: 103 — ABNORMAL HIGH
Potassium: 4.2
Sodium: 139

## 2011-07-26 LAB — POCT I-STAT 4, (NA,K, GLUC, HGB,HCT)
Glucose, Bld: 89
HCT: 35 — ABNORMAL LOW
Hemoglobin: 11.9 — ABNORMAL LOW
Operator id: 268271
Potassium: 4.8
Sodium: 137

## 2011-07-26 LAB — BASIC METABOLIC PANEL
BUN: 34 — ABNORMAL HIGH
CO2: 24
Calcium: 9.2
Chloride: 105
Creatinine, Ser: 2.26 — ABNORMAL HIGH
GFR calc Af Amer: 34 — ABNORMAL LOW
GFR calc non Af Amer: 28 — ABNORMAL LOW
Glucose, Bld: 107 — ABNORMAL HIGH
Potassium: 5.3 — ABNORMAL HIGH
Sodium: 136

## 2011-07-30 LAB — POCT I-STAT 4, (NA,K, GLUC, HGB,HCT)
Glucose, Bld: 96
HCT: 41
Hemoglobin: 13.9
Operator id: 268271
Potassium: 4.4
Sodium: 137

## 2012-02-04 ENCOUNTER — Encounter (HOSPITAL_COMMUNITY): Payer: Self-pay

## 2012-02-04 ENCOUNTER — Emergency Department (HOSPITAL_COMMUNITY)
Admission: EM | Admit: 2012-02-04 | Discharge: 2012-02-04 | Disposition: A | Discharge status: Home / Self Care | Payer: Medicare Other | Attending: Emergency Medicine | Admitting: Emergency Medicine

## 2012-02-04 DIAGNOSIS — Z8673 Personal history of transient ischemic attack (TIA), and cerebral infarction without residual deficits: Secondary | ICD-10-CM | POA: Insufficient documentation

## 2012-02-04 DIAGNOSIS — R55 Syncope and collapse: Secondary | ICD-10-CM | POA: Insufficient documentation

## 2012-02-04 DIAGNOSIS — R279 Unspecified lack of coordination: Secondary | ICD-10-CM | POA: Insufficient documentation

## 2012-02-04 DIAGNOSIS — I2510 Atherosclerotic heart disease of native coronary artery without angina pectoris: Secondary | ICD-10-CM | POA: Insufficient documentation

## 2012-02-04 DIAGNOSIS — F29 Unspecified psychosis not due to a substance or known physiological condition: Secondary | ICD-10-CM | POA: Insufficient documentation

## 2012-02-04 DIAGNOSIS — E860 Dehydration: Secondary | ICD-10-CM | POA: Insufficient documentation

## 2012-02-04 DIAGNOSIS — R404 Transient alteration of awareness: Secondary | ICD-10-CM | POA: Insufficient documentation

## 2012-02-04 DIAGNOSIS — I10 Essential (primary) hypertension: Secondary | ICD-10-CM | POA: Insufficient documentation

## 2012-02-04 DIAGNOSIS — Z79899 Other long term (current) drug therapy: Secondary | ICD-10-CM | POA: Insufficient documentation

## 2012-02-04 HISTORY — DX: Encounter for other specified aftercare: Z51.89

## 2012-02-04 HISTORY — DX: Disorder of kidney and ureter, unspecified: N28.9

## 2012-02-04 HISTORY — DX: Unspecified osteoarthritis, unspecified site: M19.90

## 2012-02-04 HISTORY — DX: Essential (primary) hypertension: I10

## 2012-02-04 HISTORY — DX: Cerebral infarction, unspecified: I63.9

## 2012-02-04 HISTORY — DX: Atherosclerotic heart disease of native coronary artery without angina pectoris: I25.10

## 2012-02-04 LAB — COMPREHENSIVE METABOLIC PANEL
ALT: 11 U/L (ref 0–53)
AST: 15 U/L (ref 0–37)
Alkaline Phosphatase: 56 U/L (ref 39–117)
CO2: 20 mEq/L (ref 19–32)
Chloride: 105 mEq/L (ref 96–112)
GFR calc non Af Amer: 26 mL/min — ABNORMAL LOW (ref >90)
Potassium: 4 mEq/L (ref 3.5–5.1)
Sodium: 137 mEq/L (ref 135–145)
Total Bilirubin: 0.5 mg/dL (ref 0.3–1.2)

## 2012-02-04 LAB — URINALYSIS, ROUTINE W REFLEX MICROSCOPIC
Bilirubin Urine: NEGATIVE
Glucose, UA: NEGATIVE mg/dL
Hgb urine dipstick: NEGATIVE
Ketones, ur: NEGATIVE mg/dL
Leukocytes, UA: NEGATIVE
Nitrite: NEGATIVE
Protein, ur: 30 mg/dL — AB
Specific Gravity, Urine: 1.012 (ref 1.005–1.030)
Urobilinogen, UA: 1 mg/dL (ref 0.0–1.0)
pH: 6 (ref 5.0–8.0)

## 2012-02-04 LAB — CBC
Hemoglobin: 11.7 g/dL — ABNORMAL LOW (ref 13.0–17.0)
MCH: 33.6 pg (ref 26.0–34.0)
MCHC: 35 g/dL (ref 30.0–36.0)
Platelets: 115 10*3/uL — ABNORMAL LOW (ref 150–400)
RDW: 13.4 % (ref 11.5–15.5)

## 2012-02-04 LAB — TROPONIN I: Troponin I: <0.3 ng/mL (ref <0.30)

## 2012-02-04 LAB — DIFFERENTIAL
Basophils Relative: 0 % (ref 0–1)
Eosinophils Absolute: 0.1 10*3/uL (ref 0.0–0.7)
Monocytes Relative: 10 % (ref 3–12)
Neutrophils Relative %: 64 % (ref 43–77)

## 2012-02-04 MED ORDER — SODIUM CHLORIDE 0.9 % IV BOLUS (SEPSIS)
500.0000 mL | Freq: Once | INTRAVENOUS | Status: AC
  Administered 2012-02-04: 500 mL via INTRAVENOUS

## 2012-02-04 MED ORDER — SODIUM CHLORIDE 0.9 % IV SOLN: INTRAVENOUS | Status: DC

## 2012-02-04 NOTE — Discharge Instructions (Signed)
Drink an extra 2-3 glasses of water each day. Stay out of hot environments. See your doctor next week for a checkup.   Dehydration, Adult Dehydration is when you lose more fluids from the body than you take in. Vital organs like the kidneys, brain, and heart cannot function without a proper amount of fluids and salt. Any loss of fluids from the body can cause dehydration.  CAUSES   Vomiting.   Diarrhea.   Excessive sweating.   Excessive urine output.   Fever.  SYMPTOMS  Mild dehydration  Thirst.   Dry lips.   Slightly dry mouth.  Moderate dehydration  Very dry mouth.   Sunken eyes.   Skin does not bounce back quickly when lightly pinched and released.   Dark urine and decreased urine production.   Decreased tear production.   Headache.  Severe dehydration  Very dry mouth.   Extreme thirst.   Rapid, weak pulse (more than 100 beats per minute at rest).   Cold hands and feet.   Not able to sweat in spite of heat and temperature.   Rapid breathing.   Blue lips.   Confusion and lethargy.   Difficulty being awakened.   Minimal urine production.   No tears.  DIAGNOSIS  Your caregiver will diagnose dehydration based on your symptoms and your exam. Blood and urine tests will help confirm the diagnosis. The diagnostic evaluation should also identify the cause of dehydration. TREATMENT  Treatment of mild or moderate dehydration can often be done at home by increasing the amount of fluids that you drink. It is best to drink small amounts of fluid more often. Drinking too much at one time can make vomiting worse. Refer to the home care instructions below. Severe dehydration needs to be treated at the hospital where you will probably be given intravenous (IV) fluids that contain water and electrolytes. HOME CARE INSTRUCTIONS   Ask your caregiver about specific rehydration instructions.   Drink enough fluids to keep your urine clear or pale yellow.   Drink  small amounts frequently if you have nausea and vomiting.   Eat as you normally do.   Avoid:   Foods or drinks high in sugar.   Carbonated drinks.   Juice.   Extremely hot or cold fluids.   Drinks with caffeine.   Fatty, greasy foods.   Alcohol.   Tobacco.   Overeating.   Gelatin desserts.   Wash your hands well to avoid spreading bacteria and viruses.   Only take over-the-counter or prescription medicines for pain, discomfort, or fever as directed by your caregiver.   Ask your caregiver if you should continue all prescribed and over-the-counter medicines.   Keep all follow-up appointments with your caregiver.  SEEK MEDICAL CARE IF:  You have abdominal pain and it increases or stays in one area (localizes).   You have a rash, stiff neck, or severe headache.   You are irritable, sleepy, or difficult to awaken.   You are weak, dizzy, or extremely thirsty.  SEEK IMMEDIATE MEDICAL CARE IF:   You are unable to keep fluids down or you get worse despite treatment.   You have frequent episodes of vomiting or diarrhea.   You have blood or green matter (bile) in your vomit.   You have blood in your stool or your stool looks black and tarry.   You have not urinated in 6 to 8 hours, or you have only urinated a small amount of very dark urine.   You  have a fever.   You faint.  MAKE SURE YOU:   Understand these instructions.   Will watch your condition.   Will get help right away if you are not doing well or get worse.  Document Released: 10/01/2005 Document Revised: 09/20/2011 Document Reviewed: 05/21/2011 [facility] Patient Information 2012 [place], [place].

## 2012-02-04 NOTE — ED Provider Notes (Cosign Needed)
History     CSN: [id number]  Arrival date & time 02/04/12  1235   First MD Initiated Contact with Patient 02/04/12 1252      Chief Complaint  Patient presents with  . Loss of Consciousness    (Consider location/radiation/quality/duration/timing/severity/associated sxs/prior treatment) HPI Comments: Gavin Duran is a 78 y.o. Male who presents with a report of near-syncope followed by syncope. He was found by family member in his workshop, confused, and ataxic. He walked back to his home where an ambulance was called. On arrival. The ambulance got him up to walk him and he passed out for a short period of time. He did not have an unprotected fall. He aroused to his usual baseline status of her heart and oriented x3. There are no other reported problems. Patient is hearing impaired.   Patient is a 78 y.o. male presenting with syncope. The history is provided by the patient and the EMS personnel.  Loss of Consciousness    Past Medical History  Diagnosis Date  . Stroke   . Arthritis   . Blood transfusion   . Renal disorder   . Coronary artery disease   . Hypertension     Past Surgical History  Procedure Date  . Eye surgery   . Back surgery   . Inner ear surgery     No family history on file.  History  Substance Use Topics  . Smoking status: Never Smoker   . Smokeless tobacco: Current User    Types: Chew  . Alcohol Use: No      Review of Systems  Cardiovascular: Positive for syncope.  All other systems reviewed and are negative.    Allergies  Review of patient's allergies indicates no known allergies.  Home Medications   Current Outpatient Rx  Name Route Sig Dispense Refill  . AMLODIPINE BESYLATE 10 MG PO TABS Oral Take 10 mg by mouth daily.    Marland Kitchen CLOPIDOGREL BISULFATE 75 MG PO TABS Oral Take 75 mg by mouth daily.    Marland Kitchen DOXEPIN HCL 25 MG PO CAPS Oral Take 25 mg by mouth at bedtime as needed. depression    . OVER THE COUNTER MEDICATION Oral Take 1 tablet by  mouth at bedtime. Macular Protect complete-S from [facility]    . OVER THE COUNTER MEDICATION Oral Take 1 tablet by mouth at bedtime. Macular Protect Omega-3 companion from [facility]    . SIMVASTATIN 40 MG PO TABS Oral Take 40 mg by mouth every evening.    Marland Kitchen TAMSULOSIN HCL 0.4 MG PO CAPS Oral Take 0.4 mg by mouth daily.      BP 136/60  Pulse 83  Temp(Src) 97.5 F (36.4 C) (Oral)  Resp 22  SpO2 98%  Physical Exam  Nursing note and vitals reviewed. Constitutional: He is oriented to person, place, and time. He appears well-developed and well-nourished.  HENT:  Head: Normocephalic and atraumatic.  Right Ear: External ear normal.  Left Ear: External ear normal.       Mucous membranes dry  Eyes: Conjunctivae and EOM are normal. Pupils are equal, round, and reactive to light.       Conjunctivae normal  Neck: Normal range of motion and phonation normal. Neck supple.  Cardiovascular: Normal rate, regular rhythm, normal heart sounds and intact distal pulses.   Pulmonary/Chest: Effort normal and breath sounds normal. He exhibits no bony tenderness.  Abdominal: Soft. Normal appearance. There is no tenderness.  Musculoskeletal: Normal range of motion.  Neurological: He is alert and oriented to person, place, and time. He has normal strength. No sensory deficit. He exhibits normal muscle tone. Coordination normal.       No facial asymmetry  Skin: Skin is warm, dry and intact.  Psychiatric: He has a normal mood and affect. His behavior is normal. Judgment and thought content normal.    ED Course  Procedures (including critical care time) Emergency department treatment: IV fluids, bolus, and drip.  3:47 PM Reevaluation with update and discussion. After initial assessment and treatment, an updated evaluation reveals  he feels well. Orthostatics negative. He has been able to eat and drink without problem.Mancel Bale L      Date: 02/04/2012  Rate: 50  Rhythm: sinus  bradycardia  QRS Axis: left  Intervals: normal  ST/T Wave abnormalities: nonspecific T wave changes  Conduction Disutrbances:none  Narrative Interpretation:   Old EKG Reviewed: unchanged    Labs Reviewed  COMPREHENSIVE METABOLIC PANEL - Abnormal; Notable for the following:    Glucose, Bld 115 (*)    BUN 25 (*)    Creatinine, Ser 2.12 (*)    Albumin 3.4 (*)    GFR calc non Af Amer 26 (*)    GFR calc Af Amer 30 (*)    All other components within normal limits  CBC - Abnormal; Notable for the following:    RBC 3.48 (*)    Hemoglobin 11.7 (*)    HCT 33.4 (*)    Platelets 115 (*) PLATELET COUNT CONFIRMED BY SMEAR   All other components within normal limits  DIFFERENTIAL  TROPONIN I  URINALYSIS, ROUTINE W REFLEX MICROSCOPIC  URINE CULTURE     1. Dehydration       MDM      Syncope  associated with dehydration. Doubt ACS, PE, metabolic instability or occult infection.. the creatinine is somewhat higher than the recent baseline, but it has been this high before. He is improved with treatment in ED and is stable for discharge.   Plan: Home Medications- no new or changes; Home Treatments- increase PO fluids; Recommended follow up- PCP 1 week f/u    Flint Melter, MD 02/05/12 [phone]

## 2012-02-04 NOTE — ED Notes (Addendum)
Per EMS, pt from home.  Pt was in an outbuilding working on a dryer and had on 2 layers of clothes.  Niece went by to check on him and found him pale, confused, and weak.  Niece walked pt back into house and called EMS.  Upon EMS arrival pt was A & O x 4 and stating he was fine.  BP sitting was 110/60.  When pt stood up BP was 72 and pt had a syncopal episode witnessed by EMS.  CBG was 118.

## 2012-02-05 LAB — URINE CULTURE
Colony Count: NO GROWTH
Culture: NO GROWTH

## 2014-05-03 ENCOUNTER — Other Ambulatory Visit: Payer: Self-pay | Admitting: Internal Medicine

## 2014-05-03 DIAGNOSIS — R7989 Other specified abnormal findings of blood chemistry: Secondary | ICD-10-CM

## 2014-05-11 ENCOUNTER — Ambulatory Visit
Admission: RE | Admit: 2014-05-11 | Discharge: 2014-05-11 | Disposition: A | Discharge status: Home / Self Care | Payer: Medicare Other | Source: Ambulatory Visit | Attending: Internal Medicine | Admitting: Internal Medicine

## 2014-05-11 DIAGNOSIS — R7989 Other specified abnormal findings of blood chemistry: Secondary | ICD-10-CM

## 2014-05-12 ENCOUNTER — Other Ambulatory Visit: Payer: BC Managed Care – PPO

## 2014-09-13 ENCOUNTER — Ambulatory Visit
Admission: RE | Admit: 2014-09-13 | Discharge: 2014-09-13 | Disposition: A | Discharge status: Home / Self Care | Payer: Medicare Other | Source: Ambulatory Visit | Attending: Internal Medicine | Admitting: Internal Medicine

## 2014-09-13 ENCOUNTER — Other Ambulatory Visit: Payer: Self-pay | Admitting: Internal Medicine

## 2014-09-13 DIAGNOSIS — M549 Dorsalgia, unspecified: Secondary | ICD-10-CM

## 2014-09-13 DIAGNOSIS — M25559 Pain in unspecified hip: Secondary | ICD-10-CM

## 2014-09-13 DIAGNOSIS — R52 Pain, unspecified: Secondary | ICD-10-CM

## 2015-06-11 ENCOUNTER — Emergency Department (HOSPITAL_COMMUNITY)
Admission: EM | Admit: 2015-06-11 | Discharge: 2015-06-11 | Disposition: A | Discharge status: Home / Self Care | Payer: Medicare Other | Attending: Emergency Medicine | Admitting: Emergency Medicine

## 2015-06-11 ENCOUNTER — Encounter (HOSPITAL_COMMUNITY): Payer: Self-pay | Admitting: Emergency Medicine

## 2015-06-11 DIAGNOSIS — Z8739 Personal history of other diseases of the musculoskeletal system and connective tissue: Secondary | ICD-10-CM | POA: Diagnosis not present

## 2015-06-11 DIAGNOSIS — S41101A Unspecified open wound of right upper arm, initial encounter: Secondary | ICD-10-CM | POA: Insufficient documentation

## 2015-06-11 DIAGNOSIS — W01198A Fall on same level from slipping, tripping and stumbling with subsequent striking against other object, initial encounter: Secondary | ICD-10-CM | POA: Diagnosis not present

## 2015-06-11 DIAGNOSIS — I2510 Atherosclerotic heart disease of native coronary artery without angina pectoris: Secondary | ICD-10-CM | POA: Insufficient documentation

## 2015-06-11 DIAGNOSIS — Z8673 Personal history of transient ischemic attack (TIA), and cerebral infarction without residual deficits: Secondary | ICD-10-CM | POA: Insufficient documentation

## 2015-06-11 DIAGNOSIS — Z7902 Long term (current) use of antithrombotics/antiplatelets: Secondary | ICD-10-CM | POA: Insufficient documentation

## 2015-06-11 DIAGNOSIS — Z87448 Personal history of other diseases of urinary system: Secondary | ICD-10-CM | POA: Diagnosis not present

## 2015-06-11 DIAGNOSIS — Y998 Other external cause status: Secondary | ICD-10-CM | POA: Insufficient documentation

## 2015-06-11 DIAGNOSIS — Y9389 Activity, other specified: Secondary | ICD-10-CM | POA: Insufficient documentation

## 2015-06-11 DIAGNOSIS — S4991XA Unspecified injury of right shoulder and upper arm, initial encounter: Secondary | ICD-10-CM | POA: Diagnosis present

## 2015-06-11 DIAGNOSIS — I10 Essential (primary) hypertension: Secondary | ICD-10-CM | POA: Diagnosis not present

## 2015-06-11 DIAGNOSIS — H9190 Unspecified hearing loss, unspecified ear: Secondary | ICD-10-CM | POA: Diagnosis not present

## 2015-06-11 DIAGNOSIS — W010XXA Fall on same level from slipping, tripping and stumbling without subsequent striking against object, initial encounter: Secondary | ICD-10-CM

## 2015-06-11 DIAGNOSIS — T148XXA Other injury of unspecified body region, initial encounter: Secondary | ICD-10-CM

## 2015-06-11 DIAGNOSIS — Z79899 Other long term (current) drug therapy: Secondary | ICD-10-CM | POA: Diagnosis not present

## 2015-06-11 DIAGNOSIS — Y92092 Bedroom in other non-institutional residence as the place of occurrence of the external cause: Secondary | ICD-10-CM | POA: Diagnosis not present

## 2015-06-11 MED ORDER — BACITRACIN ZINC 500 UNIT/GM EX OINT
TOPICAL_OINTMENT | Freq: Two times a day (BID) | CUTANEOUS | Status: DC
  Administered 2015-06-11: 12:00:00 via TOPICAL
  Filled 2015-06-11: qty 0.9

## 2015-06-11 NOTE — ED Provider Notes (Signed)
CSN: [id number]     Arrival date & time 06/11/15  1120 History   First MD Initiated Contact with Patient 06/11/15 1130     Chief Complaint  Patient presents with  . Fall  . Extremity Laceration     (Consider location/radiation/quality/duration/timing/severity/associated sxs/prior Treatment) Patient is a 81 y.o. male presenting with fall. The history is provided by the patient and a relative.  Fall Pertinent negatives include no chest pain, no abdominal pain, no headaches and no shortness of breath.  Patient s/p fall at [facility] yesterday. At baseline, walks very slowly w walker. Pt indicates had trip and fall. No faintness, dizziness or loc w fall.  Pt w skin tears to right upper arm and forearm which he bandaged himself.  Pt denies any headache. No neck or back pain. Pt denies extremity pain, only the skin tears. No pain w rom extremities. Family indicates pt has had tetanus imm within the past few years.  Since fall, pts mental status and functional ability have been c/w baseline. Has been ambulatory today, ate normal breakfast. No anticoag use at home.      Past Medical History  Diagnosis Date  . Stroke   . Arthritis   . Blood transfusion   . Renal disorder   . Coronary artery disease   . Hypertension    Past Surgical History  Procedure Laterality Date  . Eye surgery    . Back surgery    . Inner ear surgery     No family history on file. Social History  Substance Use Topics  . Smoking status: Never Smoker   . Smokeless tobacco: Current User    Types: Chew  . Alcohol Use: No    Review of Systems  Constitutional: Negative for fever.  HENT: Negative for nosebleeds and sore throat.   Eyes: Negative for redness.  Respiratory: Negative for shortness of breath.   Cardiovascular: Negative for chest pain.  Gastrointestinal: Negative for nausea, vomiting and abdominal pain.  Genitourinary: Negative for flank pain.  Musculoskeletal: Negative for back pain and neck pain.  Skin:  Positive for wound.  Neurological: Negative for headaches.  Hematological: Does not bruise/bleed easily.  Psychiatric/Behavioral: Negative for confusion.      Allergies  Review of patient's allergies indicates no known allergies.  Home Medications   Prior to Admission medications   Medication Sig Start Date End Date Taking? Authorizing Provider  amLODipine (NORVASC) 10 MG tablet Take 10 mg by mouth daily.    Historical Provider, MD  clopidogrel (PLAVIX) 75 MG tablet Take 75 mg by mouth daily.    Historical Provider, MD  doxepin (SINEQUAN) 25 MG capsule Take 25 mg by mouth at bedtime as needed. depression    Historical Provider, MD  OVER THE COUNTER MEDICATION Take 1 tablet by mouth at bedtime. Macular Protect complete-S from [facility]    Historical Provider, MD  OVER THE COUNTER MEDICATION Take 1 tablet by mouth at bedtime. Macular Protect Omega-3 companion from [facility]    Historical Provider, MD  simvastatin (ZOCOR) 40 MG tablet Take 40 mg by mouth every evening.    Historical Provider, MD  Tamsulosin HCl (FLOMAX) 0.4 MG CAPS Take 0.4 mg by mouth daily.    Historical Provider, MD   There were no vitals taken for this visit. Physical Exam  Constitutional: He appears well-developed and well-nourished. No distress.  HENT:  Head: Atraumatic.  Eyes: Conjunctivae are normal. Pupils are equal, round, and reactive to light.  Neck: Normal  range of motion. Neck supple. No tracheal deviation present.  Cardiovascular: Normal rate and intact distal pulses.   Pulmonary/Chest: Effort normal and breath sounds normal. No accessory muscle usage. No respiratory distress. He exhibits no tenderness.  Abdominal: Soft. He exhibits no distension. There is no tenderness.  Musculoskeletal: Normal range of motion. He exhibits no edema.  Two skin tears to RUE, each approx 4-5 cm. Good rom bil ext without pain or any focal bony tenderness. Distal pulses palp. CTLS spine, non tender,  aligned, no step off.   Neurological: He is alert.  HOH. Pt alert, speech clear/fluent. Motor intact bil.   Skin: Skin is warm and dry. He is not diaphoretic.  Psychiatric: He has a normal mood and affect.  Nursing note and vitals reviewed.   ED Course  Procedures (including critical care time) Labs Review   MDM   Wounds cleaned, bacitracin and sterile dressing applied.  Reviewed nursing notes and prior charts for additional history.   Spine nt. No headache. Denies any pain. Ambulates w walker.   Pt currently appears stable for d/c.     Cathren Laine, MD 06/11/15 1139

## 2015-06-11 NOTE — ED Notes (Signed)
Pt states that when he woke up yesterday afternoon he got out of the bed and fell backwards hitting his right arm on the nightstand.  Pt has skin tear to right upper arm, that is wrapped with coban at this time.  Pt c/o right arm pain. Pt lives at [facility], independent living

## 2015-06-11 NOTE — Discharge Instructions (Signed)
It was our pleasure to provide your ER care today - we hope that you feel better.  Keep area very clean.  You may apply a thin coat of bacitracin to area for the next few days.  Fall precautions - use walker, and great care to help minimize risk of falling.  Return to ER if worse, new symptoms, severe pain, infection of wound, other concern.    Fall Prevention and Home Safety Falls cause injuries and can affect all age groups. It is possible to use preventive measures to significantly decrease the likelihood of falls. There are many simple measures which can make your home safer and prevent falls. OUTDOORS  Repair cracks and edges of walkways and driveways.  Remove high doorway thresholds.  Trim shrubbery on the main path into your home.  Have good outside lighting.  Clear walkways of tools, rocks, debris, and clutter.  Check that handrails are not broken and are securely fastened. Both sides of steps should have handrails.  Have leaves, snow, and ice cleared regularly.  Use sand or salt on walkways during winter months.  In the garage, clean up grease or oil spills. BATHROOM  Install night lights.  Install grab bars by the toilet and in the tub and shower.  Use non-skid mats or decals in the tub or shower.  Place a plastic non-slip stool in the shower to sit on, if needed.  Keep floors dry and clean up all water on the floor immediately.  Remove soap buildup in the tub or shower on a regular basis.  Secure bath mats with non-slip, double-sided rug tape.  Remove throw rugs and tripping hazards from the floors. BEDROOMS  Install night lights.  Make sure a bedside light is easy to reach.  Do not use oversized bedding.  Keep a telephone by your bedside.  Have a firm chair with side arms to use for getting dressed.  Remove throw rugs and tripping hazards from the floor. KITCHEN  Keep handles on pots and pans turned toward the center of the stove. Use back  burners when possible.  Clean up spills quickly and allow time for drying.  Avoid walking on wet floors.  Avoid hot utensils and knives.  Position shelves so they are not too high or low.  Place commonly used objects within easy reach.  If necessary, use a sturdy step stool with a grab bar when reaching.  Keep electrical cables out of the way.  Do not use floor polish or wax that makes floors slippery. If you must use wax, use non-skid floor wax.  Remove throw rugs and tripping hazards from the floor. STAIRWAYS  Never leave objects on stairs.  Place handrails on both sides of stairways and use them. Fix any loose handrails. Make sure handrails on both sides of the stairways are as long as the stairs.  Check carpeting to make sure it is firmly attached along stairs. Make repairs to worn or loose carpet promptly.  Avoid placing throw rugs at the top or bottom of stairways, or properly secure the rug with carpet tape to prevent slippage. Get rid of throw rugs, if possible.  Have an electrician put in a light switch at the top and bottom of the stairs. OTHER FALL PREVENTION TIPS  Wear low-heel or rubber-soled shoes that are supportive and fit well. Wear closed toe shoes.  When using a stepladder, make sure it is fully opened and both spreaders are firmly locked. Do not climb a closed stepladder.  Add color or contrast paint or tape to grab bars and handrails in your home. Place contrasting color strips on first and last steps.  Learn and use mobility aids as needed. Install an electrical emergency response system.  Turn on lights to avoid dark areas. Replace light bulbs that burn out immediately. Get light switches that glow.  Arrange furniture to create clear pathways. Keep furniture in the same place.  Firmly attach carpet with non-skid or double-sided tape.  Eliminate uneven floor surfaces.  Select a carpet pattern that does not visually hide the edge of steps.  Be  aware of all pets. OTHER HOME SAFETY TIPS  Set the water temperature for 120 F (48.8 C).  Keep emergency numbers on or near the telephone.  Keep smoke detectors on every level of the home and near sleeping areas. Document Released: 09/21/2002 Document Revised: 04/01/2012 Document Reviewed: 12/21/2011 [facility] Patient Information 2015 [place], [place]. This information is not intended to replace advice given to you by your health care provider. Make sure you discuss any questions you have with your health care provider.    Skin Tear Care A skin tear is a wound in which the top layer of skin has peeled off. This is a common problem with aging because the skin becomes thinner and more fragile as a person gets older. In addition, some medicines, such as oral corticosteroids, can lead to skin thinning if taken for long periods of time.  A skin tear is often repaired with tape or skin adhesive strips. This keeps the skin that has been peeled off in contact with the healthier skin beneath. Depending on the location of the wound, a bandage (dressing) may be applied over the tape or skin adhesive strips. Sometimes, during the healing process, the skin turns black and dies. Even when this happens, the torn skin acts as a good dressing until the skin underneath gets healthier and repairs itself. HOME CARE INSTRUCTIONS   Change dressings once per day or as directed by your caregiver.  Gently clean the skin tear and the area around the tear using saline solution or mild soap and water.  Do not rub the injured skin dry. Let the area air dry.  Apply petroleum jelly or an antibiotic cream or ointment to keep the tear moist. This will help the wound heal. Do not allow a scab to form.  If the dressing sticks before the next dressing change, moisten it with warm soapy water and gently remove it.  Protect the injured skin until it has healed.  Only take over-the-counter or prescription medicines as  directed by your caregiver.  Take showers or baths using warm soapy water. Apply a new dressing after the shower or bath.  Keep all follow-up appointments as directed by your caregiver.  SEEK IMMEDIATE MEDICAL CARE IF:   You have redness, swelling, or increasing pain in the skin tear.  You havepus coming from the skin tear.  You have chills.  You have a red streak that goes away from the skin tear.  You have a bad smell coming from the tear or dressing.  You have a fever or persistent symptoms for more than 2-3 days.  You have a fever and your symptoms suddenly get worse. MAKE SURE YOU:  Understand these instructions.  Will watch this condition.  Will get help right away if your child is not doing well or gets worse. Document Released: 06/26/2001 Document Revised: 06/25/2012 Document Reviewed: 04/14/2012 [facility] Patient Information 2015 [place], [place].  This information is not intended to replace advice given to you by your health care provider. Make sure you discuss any questions you have with your health care provider.

## 2019-07-16 DEATH — deceased
